# Patient Record
Sex: Female | Born: 1955 | Race: White | Hispanic: No | Marital: Married | Smoking: Never smoker
Health system: Southern US, Community
[De-identification: ages and names within clinical notes are randomized; demographics above are authoritative.]

## PROBLEM LIST (undated history)

## (undated) DIAGNOSIS — I10 Essential (primary) hypertension: Secondary | ICD-10-CM

## (undated) HISTORY — PX: CHOLECYSTECTOMY: SHX55

---

## 2010-09-20 ENCOUNTER — Emergency Department (HOSPITAL_BASED_OUTPATIENT_CLINIC_OR_DEPARTMENT_OTHER): Admission: EM | Admit: 2010-09-20 | Discharge: 2010-09-21 | Payer: Self-pay | Admitting: Emergency Medicine

## 2010-09-20 ENCOUNTER — Ambulatory Visit: Payer: Self-pay | Admitting: Diagnostic Radiology

## 2010-10-24 ENCOUNTER — Encounter
Admission: RE | Admit: 2010-10-24 | Discharge: 2010-10-31 | Payer: Self-pay | Source: Home / Self Care | Attending: Orthopedic Surgery | Admitting: Orthopedic Surgery

## 2010-10-31 ENCOUNTER — Encounter
Admission: RE | Admit: 2010-10-31 | Discharge: 2010-11-19 | Payer: Self-pay | Source: Home / Self Care | Attending: Orthopedic Surgery | Admitting: Orthopedic Surgery

## 2011-05-16 IMAGING — CR DG FINGER LITTLE 2+V*L*
4 series · 4 of 4 positions shown · non-contrast
Comparison: Prereduction left hand radiographs performed earlier
today at [DATE] p.m.

CLINICAL DATA: Status post reduction of left fifth proximal phalanx
fracture.

LEFT LITTLE FINGER 2+V

[x finger pa left]
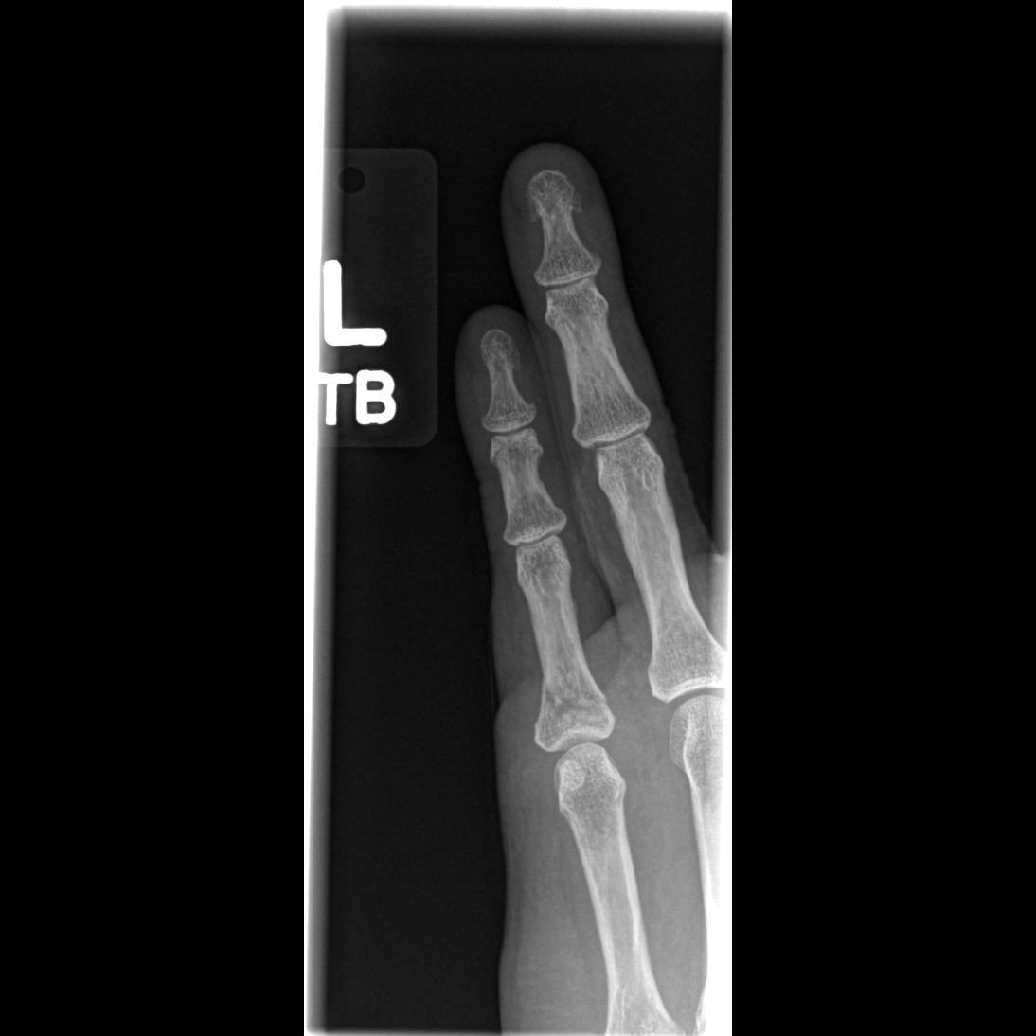

[x finger obl. left]
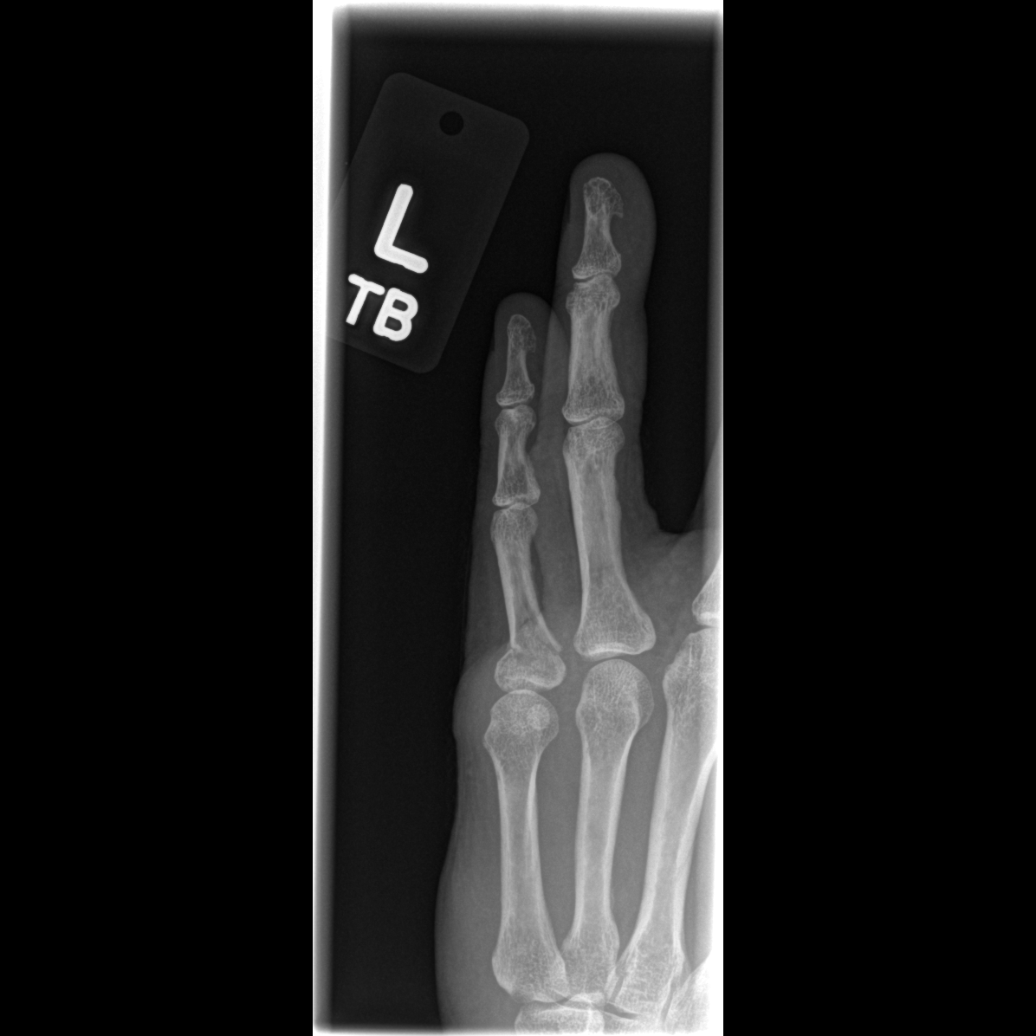

[x finger lateral left (1 of 2)]
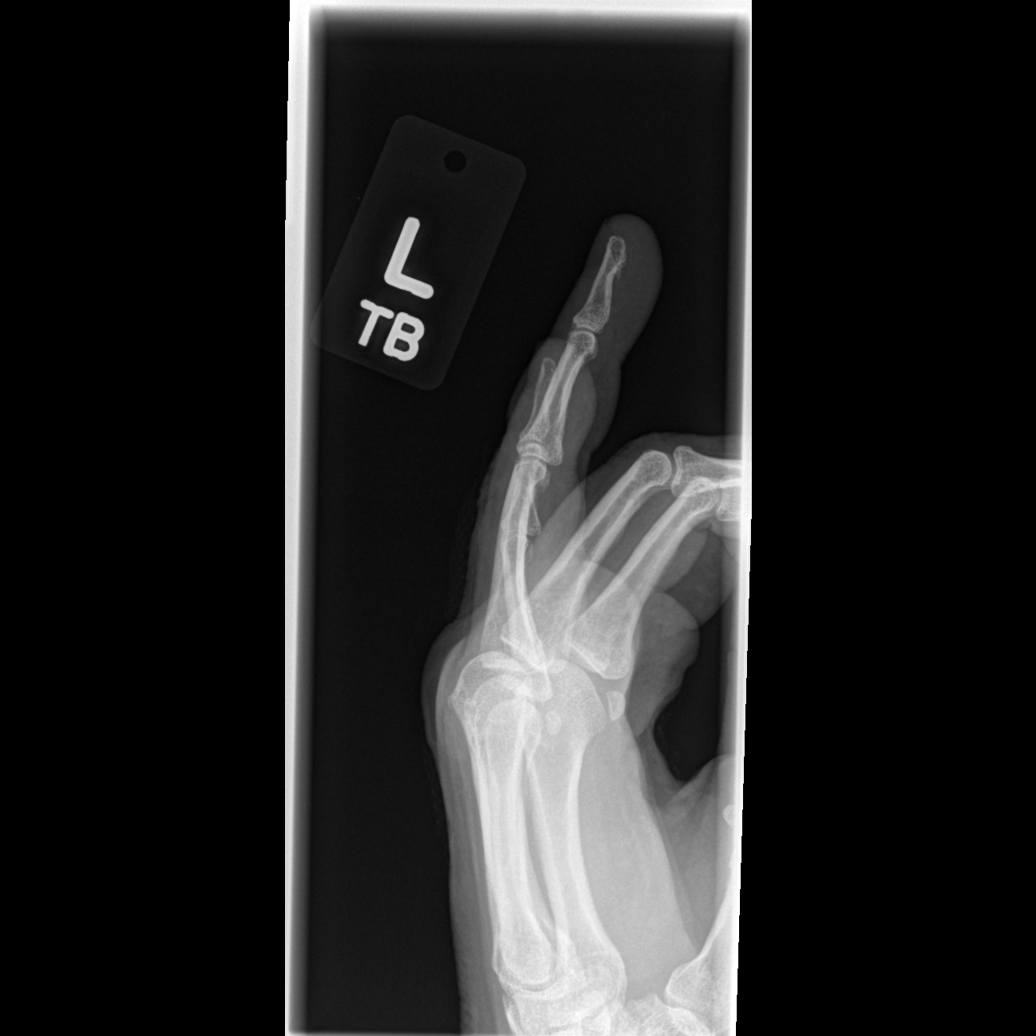

[x finger lateral left (2 of 2)]
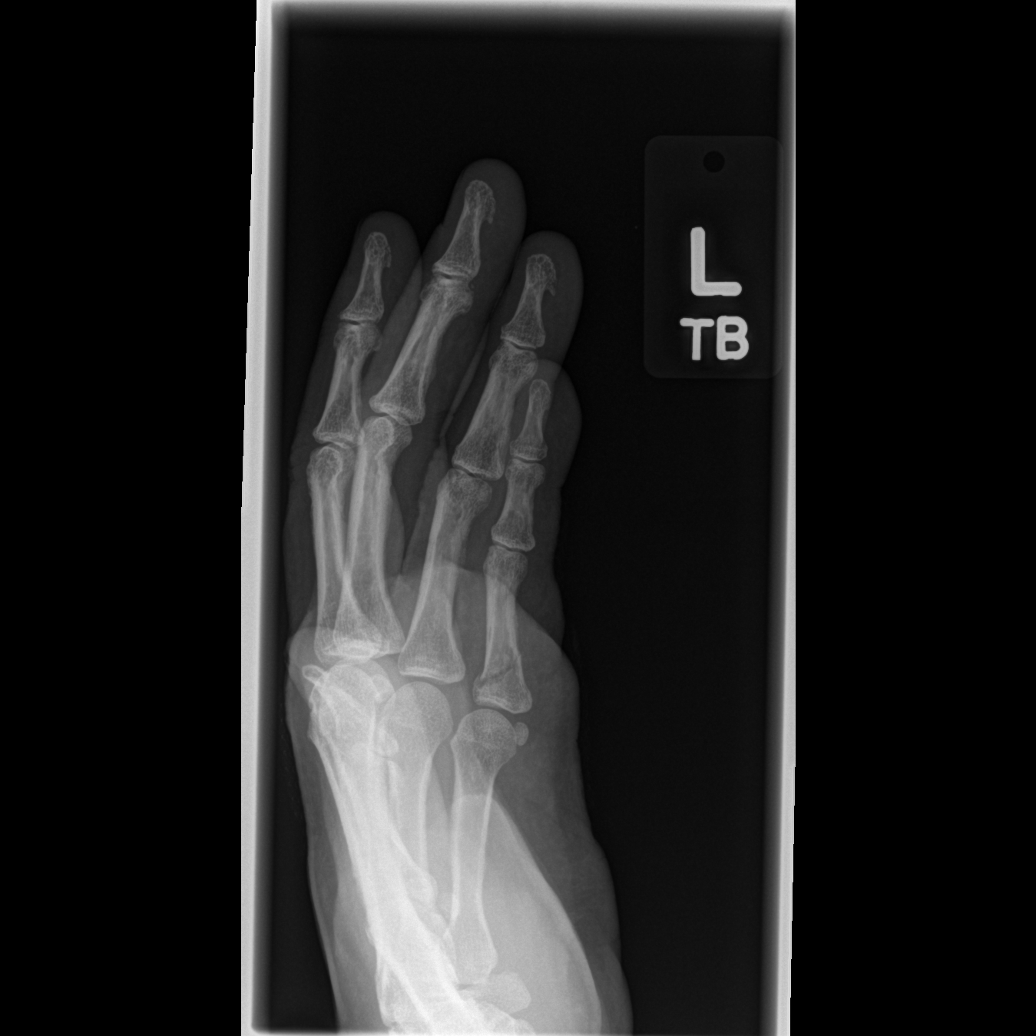

[4 of 4 positions shown; findings below may reference images not displayed]

FINDINGS: There has been interval reduction of the previously noted
displaced oblique fracture of the proximal left fifth proximal
phalanx.  It is seen in near-anatomic alignment, with mild residual
dorsal angulation.

Overlying soft tissue swelling is noted.  No new fractures are
identified.  There is no definite evidence of intra-articular
extension.  Visualized joint spaces are preserved.
IMPRESSION: Interval reduction of previously noted displaced oblique fracture
of the proximal left fifth proximal phalanx.  It is seen in near-
anatomic alignment, with mild residual dorsal angulation.  No
definite evidence of intra-articular extension.

## 2011-05-16 IMAGING — CR DG KNEE COMPLETE 4+V*L*
4 series · 4 of 4 positions shown · non-contrast
Comparison: None

CLINICAL DATA: Left knee swelling post fall

LEFT KNEE - COMPLETE 4+ VIEW

[t knee ap left]
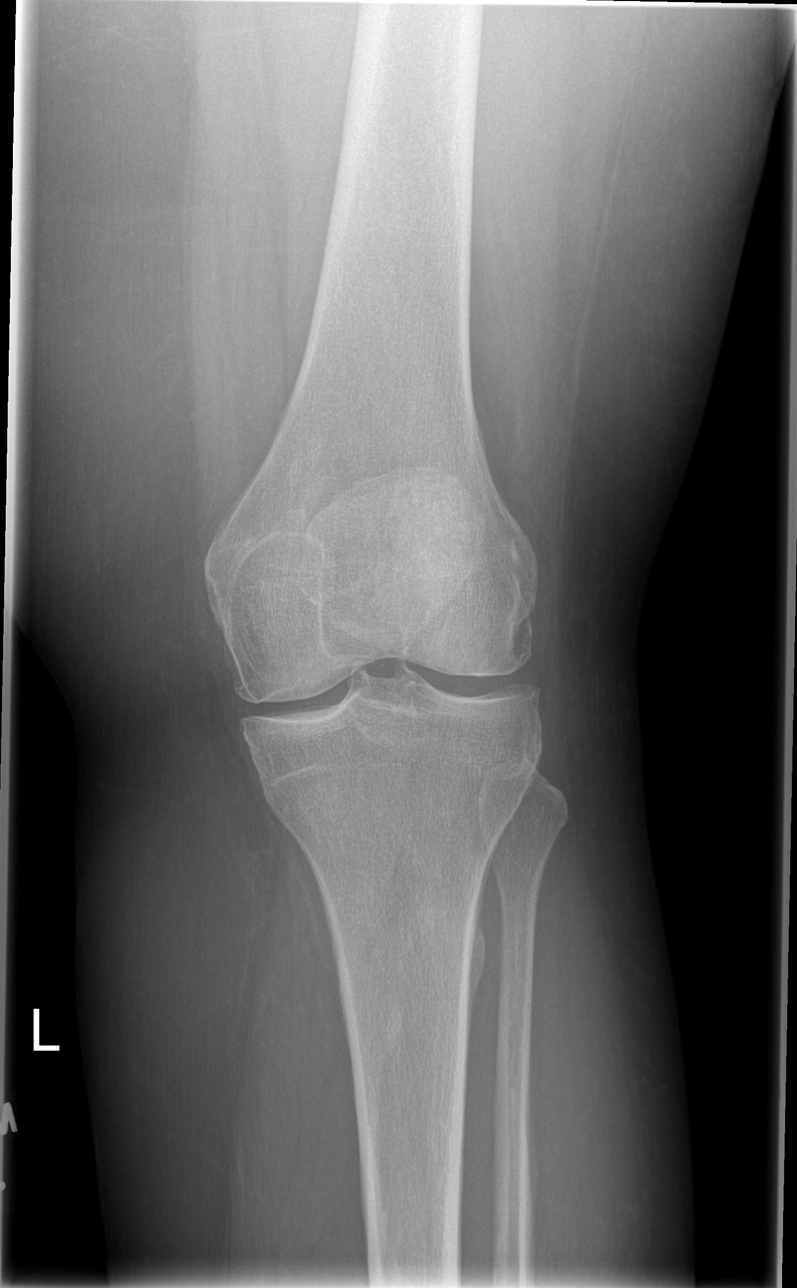

[t knee oblique left (1 of 2)]
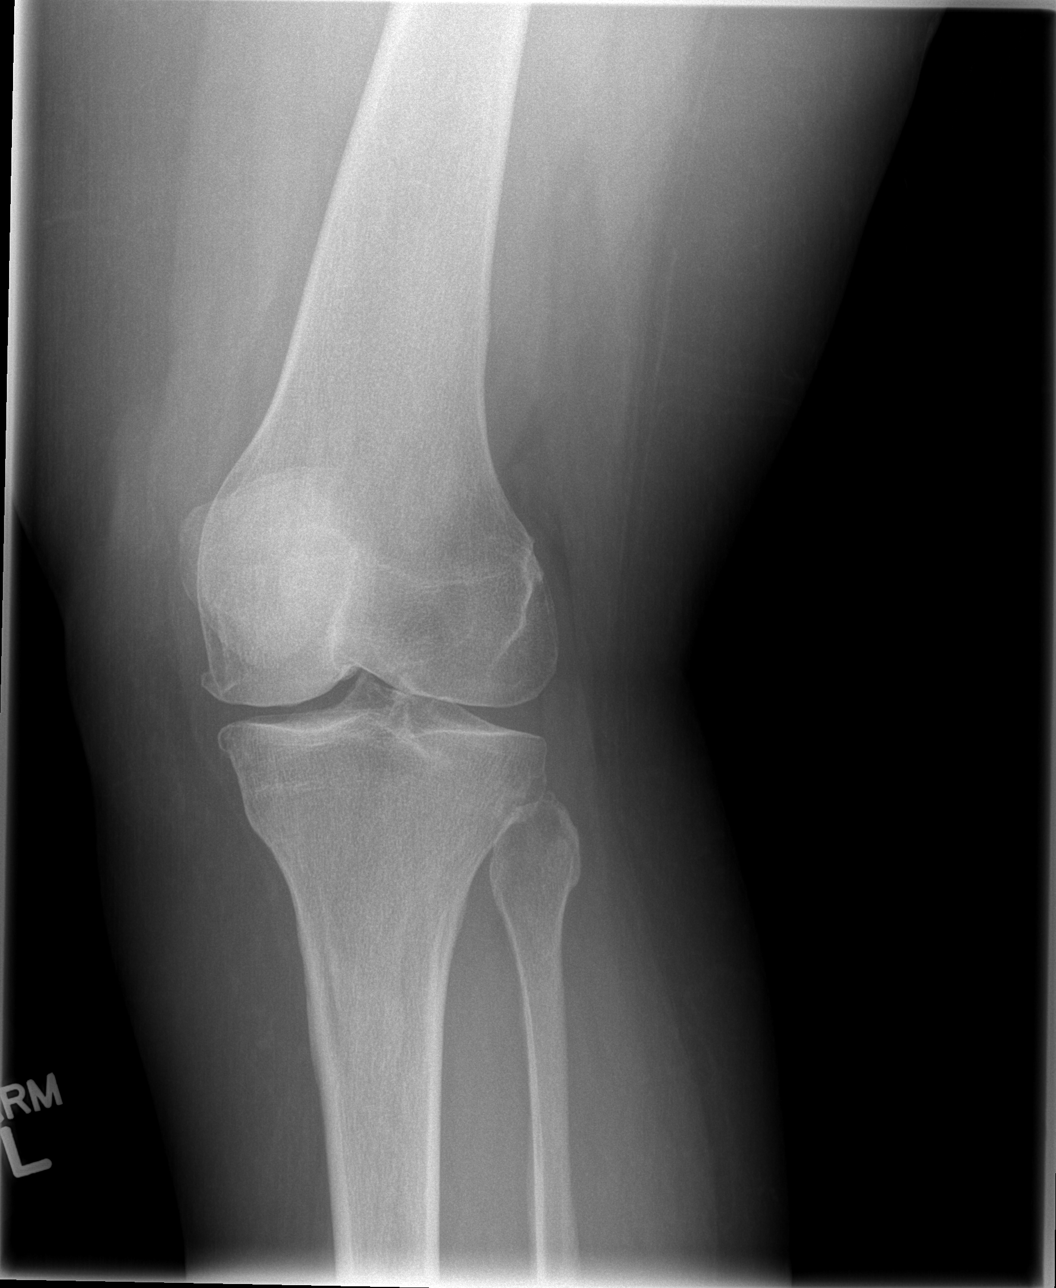

[t knee oblique left (2 of 2)]
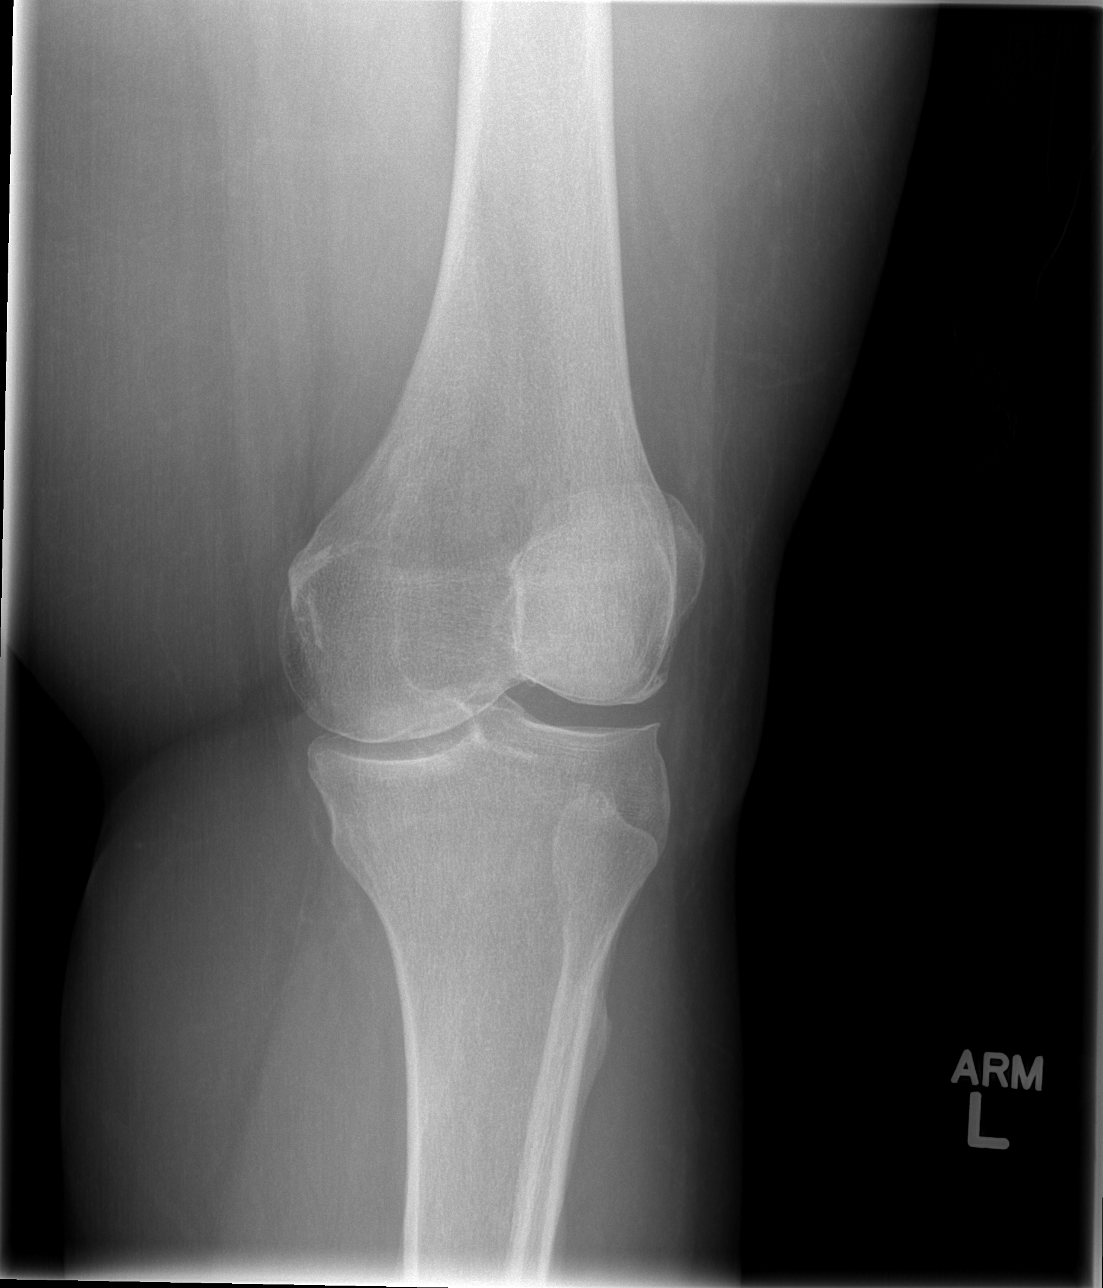

[t knee lat left]
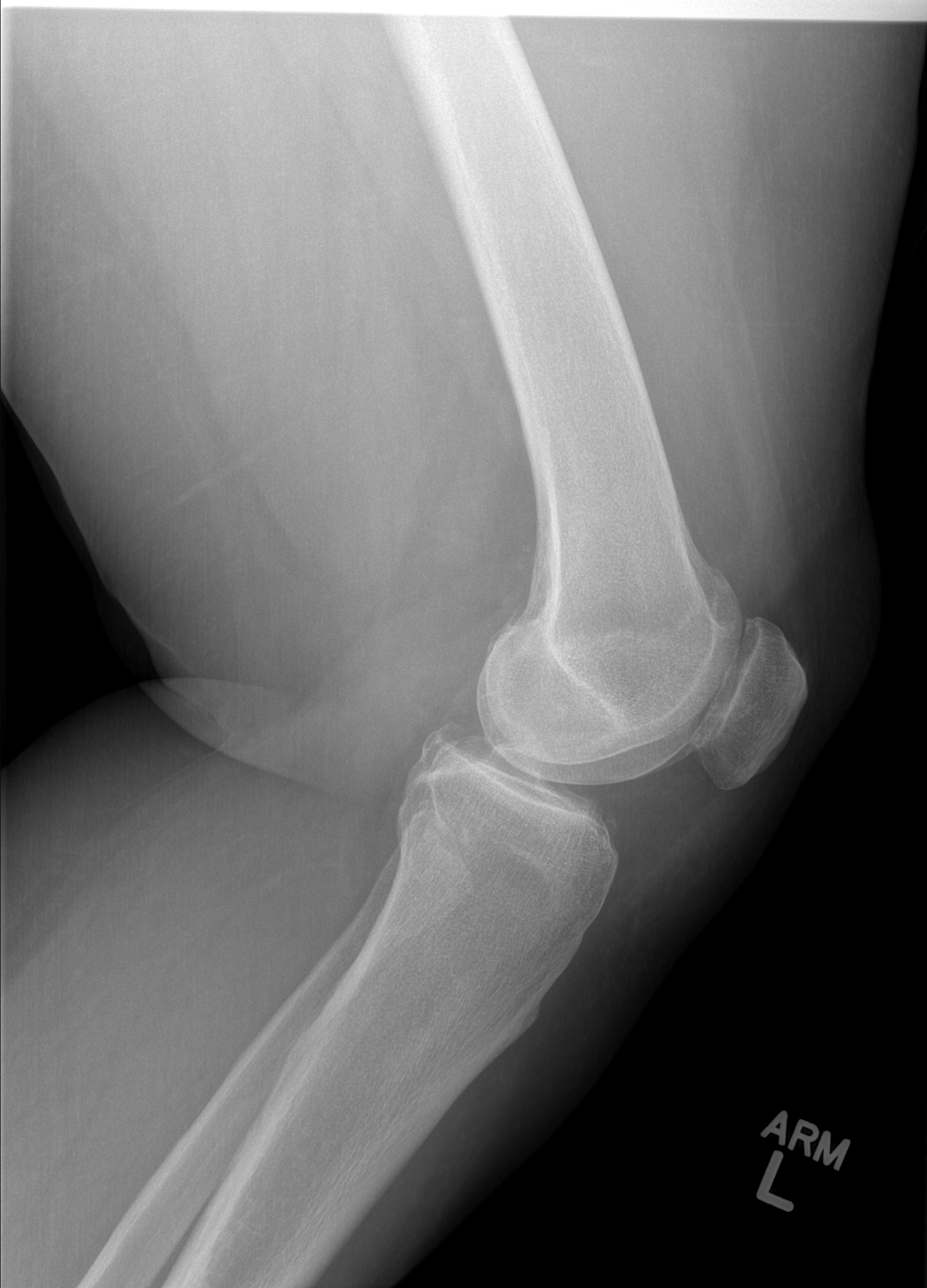

[4 of 4 positions shown; findings below may reference images not displayed]

FINDINGS: Osseous demineralization.
Medial compartment joint space narrowing and marginal spur
formation.
No acute fracture, dislocation or bone destruction.
No definite knee joint effusion.
Mild patellofemoral degenerative changes noted as well.
IMPRESSION: Osteoarthritic changes at patellofemoral joint and medial
compartment.
Osseous demineralization.
No acute bony findings.

## 2019-05-17 ENCOUNTER — Emergency Department (HOSPITAL_BASED_OUTPATIENT_CLINIC_OR_DEPARTMENT_OTHER)
Admission: EM | Admit: 2019-05-17 | Discharge: 2019-05-18 | Disposition: A | Discharge status: Home / Self Care | Payer: BC Managed Care – PPO | Attending: Emergency Medicine | Admitting: Emergency Medicine

## 2019-05-17 ENCOUNTER — Other Ambulatory Visit: Payer: Self-pay

## 2019-05-17 ENCOUNTER — Encounter (HOSPITAL_BASED_OUTPATIENT_CLINIC_OR_DEPARTMENT_OTHER): Payer: Self-pay

## 2019-05-17 DIAGNOSIS — R7309 Other abnormal glucose: Secondary | ICD-10-CM | POA: Insufficient documentation

## 2019-05-17 DIAGNOSIS — R739 Hyperglycemia, unspecified: Secondary | ICD-10-CM

## 2019-05-17 DIAGNOSIS — R0789 Other chest pain: Secondary | ICD-10-CM | POA: Insufficient documentation

## 2019-05-17 DIAGNOSIS — I10 Essential (primary) hypertension: Secondary | ICD-10-CM | POA: Insufficient documentation

## 2019-05-17 DIAGNOSIS — R1010 Upper abdominal pain, unspecified: Secondary | ICD-10-CM | POA: Diagnosis not present

## 2019-05-17 DIAGNOSIS — R1013 Epigastric pain: Secondary | ICD-10-CM | POA: Diagnosis present

## 2019-05-17 DIAGNOSIS — R1011 Right upper quadrant pain: Secondary | ICD-10-CM | POA: Diagnosis not present

## 2019-05-17 HISTORY — DX: Essential (primary) hypertension: I10

## 2019-05-17 LAB — URINALYSIS, ROUTINE W REFLEX MICROSCOPIC
Bilirubin Urine: NEGATIVE
Glucose, UA: NEGATIVE mg/dL
Hgb urine dipstick: NEGATIVE
Ketones, ur: NEGATIVE mg/dL
Leukocytes,Ua: NEGATIVE
Nitrite: NEGATIVE
Protein, ur: NEGATIVE mg/dL
Specific Gravity, Urine: 1.025 (ref 1.005–1.030)
pH: 6 (ref 5.0–8.0)

## 2019-05-17 LAB — CBC
HCT: 38.9 % (ref 36.0–46.0)
Hemoglobin: 12.1 g/dL (ref 12.0–15.0)
MCH: 24.7 pg — ABNORMAL LOW (ref 26.0–34.0)
MCHC: 31.1 g/dL (ref 30.0–36.0)
MCV: 79.4 fL — ABNORMAL LOW (ref 80.0–100.0)
Platelets: 202 10*3/uL (ref 150–400)
RBC: 4.9 MIL/uL (ref 3.87–5.11)
RDW: 15.4 % (ref 11.5–15.5)
WBC: 4.8 10*3/uL (ref 4.0–10.5)
nRBC: 0 % (ref 0.0–0.2)

## 2019-05-17 LAB — COMPREHENSIVE METABOLIC PANEL
ALT: 23 U/L (ref 0–44)
AST: 23 U/L (ref 15–41)
Albumin: 3.9 g/dL (ref 3.5–5.0)
Alkaline Phosphatase: 74 U/L (ref 38–126)
Anion gap: 11 (ref 5–15)
BUN: 15 mg/dL (ref 8–23)
CO2: 23 mmol/L (ref 22–32)
Calcium: 9.5 mg/dL (ref 8.9–10.3)
Chloride: 103 mmol/L (ref 98–111)
Creatinine, Ser: 0.68 mg/dL (ref 0.44–1.00)
GFR calc Af Amer: >60 mL/min (ref >60)
GFR calc non Af Amer: >60 mL/min (ref >60)
Glucose, Bld: 124 mg/dL — ABNORMAL HIGH (ref 70–99)
Potassium: 3.4 mmol/L — ABNORMAL LOW (ref 3.5–5.1)
Sodium: 137 mmol/L (ref 135–145)
Total Bilirubin: 0.4 mg/dL (ref 0.3–1.2)
Total Protein: 7.9 g/dL (ref 6.5–8.1)

## 2019-05-17 LAB — LIPASE, BLOOD: Lipase: 23 U/L (ref 11–51)

## 2019-05-17 MED ORDER — PANTOPRAZOLE SODIUM 40 MG PO TBEC
40.0000 mg | DELAYED_RELEASE_TABLET | Freq: Once | ORAL | Status: DC
  Filled 2019-05-17: qty 1

## 2019-05-17 MED ORDER — ONDANSETRON HCL 4 MG/2ML IJ SOLN
4.0000 mg | Freq: Once | INTRAMUSCULAR | Status: AC
  Administered 2019-05-17: 4 mg via INTRAVENOUS

## 2019-05-17 MED ORDER — ONDANSETRON HCL 4 MG/2ML IJ SOLN
INTRAMUSCULAR | Status: DC
Start: 2019-05-17 — End: 2019-05-18
  Filled 2019-05-17: qty 2

## 2019-05-17 MED ORDER — ALUM & MAG HYDROXIDE-SIMETH 200-200-20 MG/5ML PO SUSP
30.0000 mL | Freq: Once | ORAL | Status: AC
  Administered 2019-05-17: 30 mL via ORAL
  Filled 2019-05-17: qty 30

## 2019-05-17 MED ORDER — LIDOCAINE VISCOUS HCL 2 % MT SOLN
15.0000 mL | Freq: Once | OROMUCOSAL | Status: AC
  Administered 2019-05-17: 15 mL via ORAL
  Filled 2019-05-17: qty 15

## 2019-05-17 MED ORDER — POTASSIUM CHLORIDE CRYS ER 20 MEQ PO TBCR
40.0000 meq | EXTENDED_RELEASE_TABLET | Freq: Once | ORAL | Status: AC
  Administered 2019-05-17: 40 meq via ORAL
  Filled 2019-05-17: qty 2

## 2019-05-17 MED ORDER — SODIUM CHLORIDE 0.9 % IV BOLUS
1000.0000 mL | Freq: Once | INTRAVENOUS | Status: AC
  Administered 2019-05-18: 1000 mL via INTRAVENOUS

## 2019-05-17 MED ORDER — ONDANSETRON HCL 4 MG/2ML IJ SOLN
4.0000 mg | Freq: Once | INTRAMUSCULAR | Status: AC
  Administered 2019-05-17: 4 mg via INTRAVENOUS
  Filled 2019-05-17: qty 2

## 2019-05-17 NOTE — ED Notes (Signed)
Pt states nausea is better.  

## 2019-05-17 NOTE — ED Triage Notes (Signed)
C/o abd pain, n/d, chills started last night-NAD-steady gait

## 2019-05-17 NOTE — ED Notes (Signed)
Pt on monitor 

## 2019-05-17 NOTE — ED Provider Notes (Signed)
[facility] [place] [facility] DEPARTMENT Provider Note   CSN: [id number] Arrival date & time: 05/17/19  2006    History   Chief Complaint Chief Complaint  Patient presents with  . Abdominal Pain    HPI Laura Thomas is a 63 y.o. female.   The history is provided by the patient.  Abdominal Pain She has history of hypertension and cholecystectomy and comes in with onset yesterday morning of epigastric and right upper quadrant pain.  There is a sense of pressure in the chest.  There has been nausea.  Symptoms have been constant although they have waxed and waned.  It seems to be worse when she is laying flat.  It is not affected by eating or walking.  Pain was 7/10 at its worst, currently 4/10.  She did take a dose of ibuprofen which gave temporary relief.  She denies dyspnea and diaphoresis.  She has had some loose bowel movements but does not characterize it as diarrhea.  She is a non-smoker without history of diabetes or hyperlipidemia.  There is a family history of premature coronary atherosclerosis in that she has younger brothers who have had coronary stents placed.  Past Medical History:  Diagnosis Date  . Hypertension     There are no active problems to display for this patient.   Past Surgical History:  Procedure Laterality Date  . CHOLECYSTECTOMY       OB History   No obstetric history on file.      Home Medications    Prior to Admission medications   Not on File    Family History No family history on file.  Social History Social History   Tobacco Use  . Smoking status: Never Smoker  . Smokeless tobacco: Never Used  Substance Use Topics  . Alcohol use: Never    Frequency: Never  . Drug use: Never     Allergies   Erythromycin and Reglan [metoclopramide]   Review of Systems Review of Systems  Gastrointestinal: Positive for abdominal pain.  All other systems reviewed and are negative.    Physical Exam Updated Vital Signs BP 132/67  (BP Location: Left Arm)   Pulse 72   Temp 98.5 F (36.9 C) (Oral)   Resp 18   Ht 5\' 4"  (1.626 m)   Wt 87.1 kg   SpO2 99%   BMI 32.96 kg/m   Physical Exam Vitals signs and nursing note reviewed.    63 year old female, resting comfortably and in no acute distress. Vital signs are normal. Oxygen saturation is 99%, which is normal. Head is normocephalic and atraumatic. PERRLA, EOMI. Oropharynx is clear. Neck is nontender and supple without adenopathy or JVD. Back is nontender and there is no CVA tenderness. Lungs are clear without rales, wheezes, or rhonchi. Chest is nontender. Heart has regular rate and rhythm without murmur. Abdomen is soft, flat, with mild epigastric and right upper quadrant tenderness.  There is no rebound or guarding.  There are no masses or hepatosplenomegaly and peristalsis is normoactive. Extremities have no cyanosis or edema, full range of motion is present. Skin is warm and dry without rash. Neurologic: Mental status is normal, cranial nerves are intact, there are no motor or sensory deficits.  ED Treatments / Results  Labs (all labs ordered are listed, but only abnormal results are displayed) Labs Reviewed  COMPREHENSIVE METABOLIC PANEL - Abnormal; Notable for the following components:      Result Value   Potassium 3.4 (*)    Glucose,  Bld 124 (*)    All other components within normal limits  CBC - Abnormal; Notable for the following components:   MCV 79.4 (*)    MCH 24.7 (*)    All other components within normal limits  URINALYSIS, ROUTINE W REFLEX MICROSCOPIC  LIPASE, BLOOD  TROPONIN I (HIGH SENSITIVITY)    EKG EKG Interpretation  Date/Time:  Monday May 17 2019 23:48:29 EDT Ventricular Rate:  77 PR Interval:    QRS Duration: 148 QT Interval:  407 QTC Calculation: 461 R Axis:   -15 Text Interpretation:  Sinus rhythm Right bundle branch block No old tracing to compare Confirmed by Dione BoozeGlick, Merit Maybee ([id number]) on 05/18/2019 12:00:20 AM   Procedures Procedures   Medications Ordered in ED Medications  ondansetron [facility](ZOFRAN) injection 4 mg (4 mg Intravenous Given 05/17/19 2218)     Initial Impression / Assessment and Plan / ED Course  I have reviewed the triage vital signs and the nursing notes.  Pertinent lab results that were available during my care of the patient were reviewed by me and considered in my medical decision making (see chart for details).  Epigastric and right upper quadrant pain which probably is GERD.  However, she will need to have ECG checked and troponin checked to make sure it is not cardiac.  Also consider peptic ulcer disease.  Heart score is 2, which puts her at low risk for major adverse cardiac events in the next 6 weeks.  Initial labs are significant only for borderline hypokalemia, and mildly elevated glucose in the prediabetic range.  She will be given a therapeutic trial of a GI cocktail and pantoprazole, will check ECG and troponin.  Old records are reviewed, and she has no relevant past visits.  Of note, she is not on a diuretic to account for her hypokalemia.  She had significant relief with GI cocktail, refused pantoprazole because she had side effect of headache previously.  She is given a dose of famotidine.  She is discharged with instructions to take over-the-counter famotidine, use antacids as needed.  Return precautions discussed.  Follow-up with PCP regarding her GI symptoms and also borderline elevated glucose.  Final Clinical Impressions(s) / ED Diagnoses   Final diagnoses:  Upper abdominal pain  Elevated random blood glucose level    ED Discharge Orders    None       Dione BoozeGlick, [place], MD 05/18/19 [phone]

## 2019-05-18 LAB — TROPONIN I (HIGH SENSITIVITY): Troponin I (High Sensitivity): 4 ng/L (ref <18)

## 2019-05-18 MED ORDER — FAMOTIDINE 20 MG PO TABS
20.0000 mg | ORAL_TABLET | Freq: Once | ORAL | Status: AC
  Administered 2019-05-18: 01:00:00 20 mg via ORAL
  Filled 2019-05-18: qty 1

## 2019-05-18 NOTE — Discharge Instructions (Addendum)
Take famotidine (Pepcid AC) twice a day.  Use antacids as needed.  Return if symptoms are getting worse.  Your blood sugar was a little high today - 124. Your primary care provider will need to follow that to make sure you are not in the early stages of diabetes.
